# Patient Record
Sex: Male | Born: 1990 | Race: Black or African American | Hispanic: No | Marital: Single | Smoking: Current every day smoker
Health system: Southern US, Community
[De-identification: ages and names within clinical notes are randomized; demographics above are authoritative.]

## PROBLEM LIST (undated history)

## (undated) DIAGNOSIS — N200 Calculus of kidney: Secondary | ICD-10-CM

---
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
## 2013-10-01 ENCOUNTER — Emergency Department (HOSPITAL_COMMUNITY): Payer: Self-pay

## 2013-10-01 ENCOUNTER — Encounter (HOSPITAL_COMMUNITY): Payer: Self-pay | Admitting: Emergency Medicine

## 2013-10-01 ENCOUNTER — Emergency Department (HOSPITAL_COMMUNITY)
Admission: EM | Admit: 2013-10-01 | Discharge: 2013-10-01 | Disposition: A | Discharge status: Home / Self Care | Payer: Self-pay | Attending: Emergency Medicine | Admitting: Emergency Medicine

## 2013-10-01 DIAGNOSIS — N200 Calculus of kidney: Secondary | ICD-10-CM | POA: Insufficient documentation

## 2013-10-01 DIAGNOSIS — Z7901 Long term (current) use of anticoagulants: Secondary | ICD-10-CM | POA: Insufficient documentation

## 2013-10-01 LAB — URINALYSIS, ROUTINE W REFLEX MICROSCOPIC
Bilirubin Urine: NEGATIVE
Glucose, UA: NEGATIVE mg/dL
Ketones, ur: NEGATIVE mg/dL
Nitrite: NEGATIVE
Protein, ur: NEGATIVE mg/dL
Urobilinogen, UA: 1 mg/dL (ref 0.0–1.0)

## 2013-10-01 LAB — CBC WITH DIFFERENTIAL/PLATELET
Basophils Absolute: 0 10*3/uL (ref 0.0–0.1)
Basophils Relative: 1 % (ref 0–1)
Eosinophils Absolute: 0.1 10*3/uL (ref 0.0–0.7)
Eosinophils Relative: 1 % (ref 0–5)
MCH: 30.1 pg (ref 26.0–34.0)
MCHC: 35.6 g/dL (ref 30.0–36.0)
MCV: 84.6 fL (ref 78.0–100.0)
Monocytes Relative: 4 % (ref 3–12)
Platelets: 269 10*3/uL (ref 150–400)
RDW: 12.6 % (ref 11.5–15.5)
WBC: 5.3 10*3/uL (ref 4.0–10.5)

## 2013-10-01 LAB — COMPREHENSIVE METABOLIC PANEL
ALT: 20 U/L (ref 0–53)
AST: 20 U/L (ref 0–37)
Albumin: 4.1 g/dL (ref 3.5–5.2)
Alkaline Phosphatase: 61 U/L (ref 39–117)
Calcium: 9.2 mg/dL (ref 8.4–10.5)
Chloride: 102 mEq/L (ref 96–112)
GFR calc Af Amer: >90 mL/min (ref >90)
Glucose, Bld: 78 mg/dL (ref 70–99)
Sodium: 137 mEq/L (ref 135–145)
Total Bilirubin: 0.4 mg/dL (ref 0.3–1.2)
Total Protein: 7 g/dL (ref 6.0–8.3)

## 2013-10-01 LAB — URINE MICROSCOPIC-ADD ON

## 2013-10-01 MED ORDER — ONDANSETRON HCL 4 MG PO TABS: 4.0000 mg | ORAL_TABLET | Freq: Four times a day (QID) | ORAL | Status: DC

## 2013-10-01 MED ORDER — HYDROCODONE-ACETAMINOPHEN 5-325 MG PO TABS
1.0000 | ORAL_TABLET | ORAL | Status: DC | PRN
Start: 2013-10-01 — End: 2019-06-15

## 2013-10-01 MED ORDER — SODIUM CHLORIDE 0.9 % IV BOLUS (SEPSIS)
1000.0000 mL | Freq: Once | INTRAVENOUS | Status: AC
  Administered 2013-10-01: 1000 mL via INTRAVENOUS

## 2013-10-01 MED ORDER — TAMSULOSIN HCL 0.4 MG PO CAPS: 0.4000 mg | ORAL_CAPSULE | Freq: Every day | ORAL | Status: DC

## 2013-10-01 MED ORDER — IBUPROFEN 800 MG PO TABS: 800.0000 mg | ORAL_TABLET | Freq: Three times a day (TID) | ORAL | Status: DC

## 2013-10-01 MED ORDER — ONDANSETRON HCL 4 MG/2ML IJ SOLN
4.0000 mg | Freq: Once | INTRAMUSCULAR | Status: AC
  Administered 2013-10-01: 4 mg via INTRAVENOUS
  Filled 2013-10-01: qty 2

## 2013-10-01 MED ORDER — KETOROLAC TROMETHAMINE 30 MG/ML IJ SOLN
30.0000 mg | Freq: Once | INTRAMUSCULAR | Status: AC
  Administered 2013-10-01: 30 mg via INTRAVENOUS
  Filled 2013-10-01: qty 1

## 2013-10-01 NOTE — ED Provider Notes (Signed)
TIME SEEN: 11:18 AM  CHIEF COMPLAINT: Hematuria  HPI: Patient is a 22 y.o. male with no significant past medical history who presents the emergency department with hematuria and sharp left flank pain without radiation that started at 6 AM this morning. No aggravating or alleviating factors. He has had some mild pain with urination. Denies any trauma to the area. He is on anticoagulation. No penile discharge, testicular swelling or pain. No fevers, chills, nausea, vomiting or diarrhea. He states he last had sexual intercourse in June. No prior history of sexually transmitted diseases. No prior history of kidney stones.   ROS: See HPI Constitutional: no fever  Eyes: no drainage  ENT: no runny nose   Cardiovascular:  no chest pain  Resp: no SOB  GI: no vomiting GU:  dysuria Integumentary: no rash  Allergy: no hives  Musculoskeletal: no leg swelling  Neurological: no slurred speech ROS otherwise negative  PAST MEDICAL HISTORY/PAST SURGICAL HISTORY:  History reviewed. No pertinent past medical history.  MEDICATIONS:  Prior to Admission medications   Not on File    ALLERGIES:  No Known Allergies  SOCIAL HISTORY:  History  Substance Use Topics  . Smoking status: Never Smoker   . Smokeless tobacco: Not on file  . Alcohol Use: No    FAMILY HISTORY: History reviewed. No pertinent family history.  EXAM: BP 111/64  Pulse 68  Temp(Src) 98.1 F (36.7 C) (Oral)  Resp 20  Ht 5\' 10"  (1.778 m)  Wt 140 lb (63.504 kg)  BMI 20.09 kg/m2  SpO2 100% CONSTITUTIONAL: Alert and oriented and responds appropriately to questions. Well-appearing; well-nourished HEAD: Normocephalic EYES: Conjunctivae clear, PERRL ENT: normal nose; no rhinorrhea; moist mucous membranes; pharynx without lesions noted NECK: Supple, no meningismus, no LAD  CARD: RRR; S1 and S2 appreciated; no murmurs, no clicks, no rubs, no gallops RESP: Normal chest excursion without splinting or tachypnea; breath sounds clear  and equal bilaterally; no wheezes, no rhonchi, no rales,  ABD/GI: Normal bowel sounds; non-distended; soft, non-tender, no rebound, no guarding GU:  No external genital lesions, no testicular pain or swelling, no scrotal masses, 2+ femoral pulses bilaterally, blood at the urethral meatus without penile discharge BACK:  The back appears normal and is non-tender to palpation, there is no CVA tenderness EXT: Normal ROM in all joints; non-tender to palpation; no edema; normal capillary refill; no cyanosis    SKIN: Normal color for age and race; warm NEURO: Moves all extremities equally PSYCH: The patient's mood and manner are appropriate. Grooming and personal hygiene are appropriate.  MEDICAL DECISION MAKING: Patient here with hematuria, dysuria and left-sided flank pain that started this morning. He is hemodynamically stable. Suspect UTI versus kidney stone. Will obtain labs, urine and CT abdomen and pelvis. We'll give Toradol, Zofran, IV fluids.  ED PROGRESS: Patient's labs are unremarkable. His CT scan shows a tiny nonobstructing stone within the kidney without signs of hydronephrosis. Given patient's scan confirms kidney stones, suspect his symptoms today are either due to very small kidney stone that we are not seen on CT scan or a kidney stone that has recently passed. Patient reports his pain is improved after Toradol. Awaiting urinalysis.   Urine shows large blood and trace leukocytes. No other sign of infection. Urine culture is pending. Suspect patient's symptoms are due to a recently passed kidney stone or a very small stone that we are not seen on CT scan. We'll discharge home with pain medication, antibiotics, Flomax and urology followup. Given return precautions.  Patient verbalized understanding and is comfortable with plan.  Layla Maw Cecille Mcclusky, DO 10/01/13 1426

## 2013-10-01 NOTE — ED Notes (Signed)
Pt reports sudden onset of urinating blood at 0600 this am associated with Left flank pain, lower abd pain, and nausea. Pt denies any hx of kidney stone

## 2013-10-03 LAB — URINE CULTURE: Culture: NO GROWTH

## 2017-03-11 ENCOUNTER — Encounter (HOSPITAL_COMMUNITY): Payer: Self-pay | Admitting: Emergency Medicine

## 2017-03-11 ENCOUNTER — Emergency Department (HOSPITAL_COMMUNITY): Payer: Self-pay

## 2017-03-11 ENCOUNTER — Emergency Department (HOSPITAL_COMMUNITY)
Admission: EM | Admit: 2017-03-11 | Discharge: 2017-03-11 | Disposition: A | Discharge status: Home / Self Care | Payer: Self-pay | Attending: Emergency Medicine | Admitting: Emergency Medicine

## 2017-03-11 DIAGNOSIS — R1011 Right upper quadrant pain: Secondary | ICD-10-CM | POA: Insufficient documentation

## 2017-03-11 DIAGNOSIS — R1010 Upper abdominal pain, unspecified: Secondary | ICD-10-CM

## 2017-03-11 DIAGNOSIS — F17200 Nicotine dependence, unspecified, uncomplicated: Secondary | ICD-10-CM | POA: Insufficient documentation

## 2017-03-11 DIAGNOSIS — R10811 Right upper quadrant abdominal tenderness: Secondary | ICD-10-CM

## 2017-03-11 HISTORY — DX: Calculus of kidney: N20.0

## 2017-03-11 LAB — CBC
HCT: 42.9 % (ref 39.0–52.0)
Hemoglobin: 14.9 g/dL (ref 13.0–17.0)
MCH: 30.2 pg (ref 26.0–34.0)
MCHC: 34.7 g/dL (ref 30.0–36.0)
MCV: 86.8 fL (ref 78.0–100.0)
Platelets: 275 10*3/uL (ref 150–400)
RBC: 4.94 MIL/uL (ref 4.22–5.81)
RDW: 13 % (ref 11.5–15.5)
WBC: 5.9 10*3/uL (ref 4.0–10.5)

## 2017-03-11 LAB — URINALYSIS, ROUTINE W REFLEX MICROSCOPIC
Bacteria, UA: NONE SEEN
Bilirubin Urine: NEGATIVE
Glucose, UA: NEGATIVE mg/dL
Hgb urine dipstick: NEGATIVE
Ketones, ur: NEGATIVE mg/dL
Nitrite: NEGATIVE
Protein, ur: NEGATIVE mg/dL
Specific Gravity, Urine: 1.024 (ref 1.005–1.030)
pH: 7 (ref 5.0–8.0)

## 2017-03-11 LAB — COMPREHENSIVE METABOLIC PANEL
ALT: 18 U/L (ref 17–63)
AST: 24 U/L (ref 15–41)
Albumin: 4.3 g/dL (ref 3.5–5.0)
Alkaline Phosphatase: 54 U/L (ref 38–126)
Anion gap: 8 (ref 5–15)
BUN: 9 mg/dL (ref 6–20)
CO2: 24 mmol/L (ref 22–32)
Calcium: 9.3 mg/dL (ref 8.9–10.3)
Chloride: 107 mmol/L (ref 101–111)
Creatinine, Ser: 0.87 mg/dL (ref 0.61–1.24)
GFR calc Af Amer: >60 mL/min (ref >60)
GFR calc non Af Amer: >60 mL/min (ref >60)
Glucose, Bld: 74 mg/dL (ref 65–99)
Potassium: 3.6 mmol/L (ref 3.5–5.1)
Sodium: 139 mmol/L (ref 135–145)
Total Bilirubin: 0.8 mg/dL (ref 0.3–1.2)
Total Protein: 7.1 g/dL (ref 6.5–8.1)

## 2017-03-11 LAB — POC OCCULT BLOOD, ED: FECAL OCCULT BLD: NEGATIVE

## 2017-03-11 LAB — LIPASE, BLOOD: Lipase: 21 U/L (ref 11–51)

## 2017-03-11 MED ORDER — GI COCKTAIL ~~LOC~~
30.0000 mL | Freq: Once | ORAL | Status: DC
  Filled 2017-03-11: qty 30

## 2017-03-11 MED ORDER — OMEPRAZOLE 20 MG PO CPDR: 20.0000 mg | DELAYED_RELEASE_CAPSULE | Freq: Every day | ORAL | 0 refills | Status: DC

## 2017-03-11 NOTE — ED Notes (Signed)
Pty states he understands instructions. Home stable with steady gait.

## 2017-03-11 NOTE — ED Provider Notes (Signed)
[facility]-EMERGENCY DEPT Provider Note   CSN: [id number] Arrival date & time: 03/11/17  1147   By signing my name below, I, Julian Burton, attest that this documentation has been prepared under the direction and in the presence of Julian Russo, PA-C. Electronically Signed: Freida Busmaniana Burton, Scribe. 03/11/2017. 5:11 PM.  History   Chief Complaint Chief Complaint  Patient presents with  . Blood In Stools  . Abdominal Pain    The history is provided by the patient.  No language interpreter was used.    HPI Comments:  Julian Burton is a 26 y.o. male who presents to the Emergency Department complaining of intermittent cramping, periumbilical abdominal pain x 3 weeks. He rates his pain a 9.5/10. He states he occasionally has pain after eating. He reports associated episodes of bright red blood with BM seen in toilet bowl. He states the BM have been otherwise normal. Pt denies h/o similar pain. No h/o appendectomy or cholethiasis. No alleviating factors noted.  He denies fever, chills, dysuria, hematuria, testicular pain, nausea and vomiting.  Pt states he has "2 [place] drinks about every other day". Pt smokes ~ 10 cigarettes a day  Past Medical History:  Diagnosis Date  . Kidney stones     There are no active problems to display for this patient.   History reviewed. No pertinent surgical history.   Home Medications    Prior to Admission medications   Medication Sig Start Date End Date Taking? Authorizing Provider  HYDROcodone-acetaminophen (NORCO/VICODIN) 5-325 MG per tablet Take 1 tablet by mouth every 4 (four) hours as needed. 10/01/13   Ward, Layla MawKristen N, DO  ibuprofen (ADVIL,MOTRIN) 800 MG tablet Take 1 tablet (800 mg total) by mouth 3 (three) times daily. 10/01/13   Ward, Layla MawKristen N, DO  omeprazole (PRILOSEC) 20 MG capsule Take 1 capsule (20 mg total) by mouth daily. 03/11/17   Burton, Julian N, PA-C  ondansetron (ZOFRAN) 4 MG tablet Take 1 tablet (4 mg total) by mouth every 6 (six)  hours. 10/01/13   Ward, Layla MawKristen N, DO  tamsulosin (FLOMAX) 0.4 MG CAPS capsule Take 1 capsule (0.4 mg total) by mouth daily. 10/01/13   Ward, Layla MawKristen N, DO    Family History No family history on file.  Social History Social History  Substance Use Topics  . Smoking status: Current Every Day Smoker    Packs/day: 0.50  . Smokeless tobacco: Never Used  . Alcohol use 1.2 oz/week    2 Shots of liquor per week     Comment: 2 shots every other day     Allergies   Patient has no known allergies.   Review of Systems Review of Systems  Constitutional: Negative for chills and fever.  Gastrointestinal: Positive for abdominal pain and blood in stool. Negative for nausea and vomiting.  Genitourinary: Negative for dysuria, hematuria and testicular pain.  All other systems reviewed and are negative.   Physical Exam Updated Vital Signs BP 113/78 (BP Location: Right Arm)   Pulse 72   Temp 98.2 F (36.8 C)   Resp 18   Ht 5\' 10"  (1.778 m)   Wt 62.1 kg   SpO2 100%   BMI 19.66 kg/m   Physical Exam  Constitutional: He appears well-developed and well-nourished. No distress.  HENT:  Head: Normocephalic and atraumatic.  Eyes: Conjunctivae are normal.  Neck: Normal range of motion.  Cardiovascular: Normal rate, regular rhythm, normal heart sounds and intact distal pulses.  Exam reveals no friction rub.   No  murmur heard. Pulmonary/Chest: Effort normal and breath sounds normal. No respiratory distress. He has no wheezes. He has no rales.  Abdominal: Soft. Bowel sounds are normal. He exhibits no distension. There is tenderness in the right upper quadrant, epigastric area and periumbilical area. There is guarding. There is no rebound.  Genitourinary: Rectum normal and prostate normal. Rectal exam shows guaiac negative stool.  Genitourinary Comments:  Chaperone (scribe) was present for exam which was performed with no discomfort or complications.   Psychiatric: He has a normal mood and  affect. His behavior is normal.  Nursing note and vitals reviewed.    ED Treatments / Results  DIAGNOSTIC STUDIES:  Oxygen Saturation is 99% on RA, normal by my interpretation.    COORDINATION OF CARE:  1:13 PM Discussed treatment plan with pt at bedside and pt agreed to plan.  Labs (all labs ordered are listed, but only abnormal results are displayed) Labs Reviewed  URINALYSIS, ROUTINE W REFLEX MICROSCOPIC - Abnormal; Notable for the following:       Result Value   Leukocytes, UA TRACE (*)    Squamous Epithelial / LPF 0-5 (*)    All other components within normal limits  LIPASE, BLOOD  COMPREHENSIVE METABOLIC PANEL  CBC  POC OCCULT BLOOD, ED    EKG  EKG Interpretation None       Radiology US Abdomen Limited Ruq  Result Date: 03/11/2017 CLINICAL DATA:  Right upper quadrant pain. EXAM: US ABDOMEN LIMITED - RIGHT UPPER QUADRANT COMPARISON:  CT of the abdomen without contrast on 10/01/2013 FINDINGS: Gallbladder: No gallstones or wall thickening visualized. No sonographic Murphy sign noted by sonographer. Common bile duct: Diameter: 2 mm. Liver: Rounded echogenic area measuring approximately 0.8 cm is seen at the interface between the liver and the upper right kidney. This could be a small hemangioma of the liver or potentially a small angiomyolipoma of the kidney. The rest of the liver is normal. IMPRESSION: Unremarkable right upper quadrant ultrasound. Small echogenic lesion measuring 0.8 cm at the interface between the liver and upper right kidney may represent subcapsular hepatic hemangioma versus angiomyolipoma of the kidney. Electronically Signed   By: Irish Lack M.D.   On: 03/11/2017 15:07    Procedures Procedures (including critical care time)  Medications Ordered in ED Medications  gi cocktail (Maalox,Lidocaine,Donnatal) (30 mLs Oral Refused 03/11/17 1617)     Initial Impression / Assessment and Plan / ED Course  I have reviewed the triage vital signs and the  nursing notes.  Pertinent labs & imaging results that were available during my care of the patient were reviewed by me and considered in my medical decision making (see chart for details).    Patient with umbilical, epigastric, right upper quadrant abdominal pain. Labs reassuring and within normal limits. Right upper quadrant ultrasound without acute pathology. Patient is nontoxic, nonseptic appearing, in no apparent distress. VSS. Patient does not meet the SIRS or Sepsis criteria.  No indication of appendicitis, bowel obstruction, bowel perforation, cholecystitis, diverticulitis. Pt declined GI cocktail, requesting to leave. Discussed U/S findings and encouraged PCP follow up. Also encouraged pt to decrease his alcohol intake. Patient discharged home with symptomatic treatment and given strict return precautions.  Pt safe for discharge.  Patient discussed with Dr. Juleen China.  Discussed results, findings, treatment and follow up. Patient advised of return precautions. Patient verbalized understanding and agreed with plan.  Final Clinical Impressions(s) / ED Diagnoses   Final diagnoses:  RUQ abdominal tenderness  Pain of upper abdomen  New Prescriptions Discharge Medication List as of 03/11/2017  4:12 PM    START taking these medications   Details  omeprazole (PRILOSEC) 20 MG capsule Take 1 capsule (20 mg total) by mouth daily., Starting Tue 03/11/2017, Print       I personally performed the services described in this documentation, which was scribed in my presence. The recorded information has been reviewed and is accurate.   Burton, [place] N, PA-C 03/11/17 1713  Raeford Razor, MD 03/11/17 2110

## 2017-03-11 NOTE — Discharge Instructions (Signed)
Please Read instructions below. Decrease your alcohol consumption. Take 1 omeprazole (prilosec) per day as needed for stomach upset. Schedule an appointment with primary care to establish care and follow-up on your abdominal pain. Return to the ER for fever, if you stop having bowel movements, new or worsening symptoms.

## 2017-03-11 NOTE — ED Notes (Signed)
Patient called 4x, no response.

## 2017-03-11 NOTE — ED Triage Notes (Signed)
Pt has abd pain for 3 weeks- no change today-- has not tried any OTC meds. States coworkers told him that he may have H Pylori-- also c/o bright red blood in stools occasionally

## 2017-03-19 ENCOUNTER — Emergency Department (HOSPITAL_COMMUNITY)
Admission: EM | Admit: 2017-03-19 | Discharge: 2017-03-19 | Discharge status: Against Medical Advice | Payer: Self-pay | Attending: Dermatology | Admitting: Dermatology

## 2017-03-19 ENCOUNTER — Encounter (HOSPITAL_COMMUNITY): Payer: Self-pay | Admitting: *Deleted

## 2017-03-19 DIAGNOSIS — Z5321 Procedure and treatment not carried out due to patient leaving prior to being seen by health care provider: Secondary | ICD-10-CM | POA: Insufficient documentation

## 2017-03-19 DIAGNOSIS — R109 Unspecified abdominal pain: Secondary | ICD-10-CM | POA: Insufficient documentation

## 2017-03-19 DIAGNOSIS — F17200 Nicotine dependence, unspecified, uncomplicated: Secondary | ICD-10-CM | POA: Insufficient documentation

## 2017-03-19 NOTE — ED Triage Notes (Addendum)
Pt reports ongoing left side abd pain. Was seen here on 5/8 for same and states that "he just needs a checkup." denies any pain at this time. Denies n/v/d. Pt reports that he only wants an US done again to check his liver? Does not want elaborate testing done at triage.

## 2017-03-19 NOTE — ED Notes (Signed)
Pt called for vital signs x 3

## 2017-03-20 ENCOUNTER — Encounter (HOSPITAL_COMMUNITY): Payer: Self-pay | Admitting: Emergency Medicine

## 2017-03-20 ENCOUNTER — Emergency Department (HOSPITAL_COMMUNITY): Payer: Self-pay

## 2017-03-20 ENCOUNTER — Emergency Department (HOSPITAL_COMMUNITY)
Admission: EM | Admit: 2017-03-20 | Discharge: 2017-03-20 | Disposition: A | Discharge status: Home / Self Care | Payer: Self-pay | Attending: Emergency Medicine | Admitting: Emergency Medicine

## 2017-03-20 DIAGNOSIS — F17200 Nicotine dependence, unspecified, uncomplicated: Secondary | ICD-10-CM | POA: Insufficient documentation

## 2017-03-20 DIAGNOSIS — Z79899 Other long term (current) drug therapy: Secondary | ICD-10-CM | POA: Insufficient documentation

## 2017-03-20 DIAGNOSIS — R109 Unspecified abdominal pain: Secondary | ICD-10-CM | POA: Insufficient documentation

## 2017-03-20 LAB — COMPREHENSIVE METABOLIC PANEL
ALK PHOS: 52 U/L (ref 38–126)
ALT: 17 U/L (ref 17–63)
AST: 22 U/L (ref 15–41)
Albumin: 3.8 g/dL (ref 3.5–5.0)
Anion gap: 4 — ABNORMAL LOW (ref 5–15)
BUN: <5 mg/dL — ABNORMAL LOW (ref 6–20)
CALCIUM: 9 mg/dL (ref 8.9–10.3)
CO2: 30 mmol/L (ref 22–32)
CREATININE: 0.98 mg/dL (ref 0.61–1.24)
Chloride: 106 mmol/L (ref 101–111)
GFR calc Af Amer: >60 mL/min (ref >60)
Glucose, Bld: 96 mg/dL (ref 65–99)
Potassium: 4 mmol/L (ref 3.5–5.1)
Sodium: 140 mmol/L (ref 135–145)
Total Bilirubin: 0.5 mg/dL (ref 0.3–1.2)
Total Protein: 6.1 g/dL — ABNORMAL LOW (ref 6.5–8.1)

## 2017-03-20 LAB — RAPID URINE DRUG SCREEN, HOSP PERFORMED
Amphetamines: NOT DETECTED
BARBITURATES: NOT DETECTED
Benzodiazepines: NOT DETECTED
Cocaine: NOT DETECTED
Opiates: NOT DETECTED
TETRAHYDROCANNABINOL: NOT DETECTED

## 2017-03-20 LAB — URINALYSIS, ROUTINE W REFLEX MICROSCOPIC
BILIRUBIN URINE: NEGATIVE
GLUCOSE, UA: NEGATIVE mg/dL
HGB URINE DIPSTICK: NEGATIVE
KETONES UR: NEGATIVE mg/dL
Nitrite: NEGATIVE
Protein, ur: NEGATIVE mg/dL
Specific Gravity, Urine: 1.018 (ref 1.005–1.030)
pH: 6 (ref 5.0–8.0)

## 2017-03-20 LAB — CBC
HEMATOCRIT: 42.4 % (ref 39.0–52.0)
Hemoglobin: 14.7 g/dL (ref 13.0–17.0)
MCH: 30.1 pg (ref 26.0–34.0)
MCHC: 34.7 g/dL (ref 30.0–36.0)
MCV: 86.9 fL (ref 78.0–100.0)
Platelets: 261 10*3/uL (ref 150–400)
RBC: 4.88 MIL/uL (ref 4.22–5.81)
RDW: 13.5 % (ref 11.5–15.5)
WBC: 5.4 10*3/uL (ref 4.0–10.5)

## 2017-03-20 LAB — LIPASE, BLOOD: LIPASE: 23 U/L (ref 11–51)

## 2017-03-20 MED ORDER — IOPAMIDOL (ISOVUE-300) INJECTION 61%
INTRAVENOUS | Status: AC
  Administered 2017-03-20: 100 mL via INTRAVENOUS
  Filled 2017-03-20: qty 100

## 2017-03-20 NOTE — ED Notes (Addendum)
Pt left ED to go smoke

## 2017-03-20 NOTE — ED Triage Notes (Signed)
Pt sts left sided abd pain yesterday; pt denies pain today but sts chronic in nature

## 2017-03-20 NOTE — ED Provider Notes (Signed)
[facility]-EMER[facility]Y DEPT Provider Note   CSN: [id number] Arrival date & time: 03/20/17  1003     History   Chief Complaint Chief Complaint  Patient presents with  . Abdominal Pain    HPI Julian Burton is a 26 y.o. male.  26yo M who p/w left side pain. The patient reports 1 month h/o intermittent L side pain worse with movement and better at rest. It is not associated with eating. No nausea, vomiting, diarrhea; blood in stool initially but not now. No urinary sx or fevers. He has tried ibuprofen with no relief. He was evaluated here earlier this month for the same symptoms with reassuring workup. He presents because of the persistence of his intermittent pain. No recent change in physical activity.   The history is provided by the patient.  Abdominal Pain      Past Medical History:  Diagnosis Date  . Kidney stones     There are no active problems to display for this patient.   History reviewed. No pertinent surgical history.     Home Medications    Prior to Admission medications   Medication Sig Start Date End Date Taking? Authorizing Provider  HYDROcodone-acetaminophen (NORCO/VICODIN) 5-325 MG per tablet Take 1 tablet by mouth every 4 (four) hours as needed. 10/01/13   Ward, Layla Maw, DO  ibuprofen (ADVIL,MOTRIN) 800 MG tablet Take 1 tablet (800 mg total) by mouth 3 (three) times daily. 10/01/13   Ward, Layla Maw, DO  omeprazole (PRILOSEC) 20 MG capsule Take 1 capsule (20 mg total) by mouth daily. 03/11/17   Russo, [place] N, PA-C  ondansetron (ZOFRAN) 4 MG tablet Take 1 tablet (4 mg total) by mouth every 6 (six) hours. 10/01/13   Ward, Layla Maw, DO  tamsulosin (FLOMAX) 0.4 MG CAPS capsule Take 1 capsule (0.4 mg total) by mouth daily. 10/01/13   Ward, Layla Maw, DO    Family History History reviewed. No pertinent family history.  Social History Social History  Substance Use Topics  . Smoking status: Current Every Day Smoker    Packs/day: 0.50  . Smokeless  tobacco: Never Used  . Alcohol use 1.2 oz/week    2 Shots of liquor per week     Comment: 2 shots every other day     Allergies   Patient has no known allergies.   Review of Systems Review of Systems  Gastrointestinal: Positive for abdominal pain.     Physical Exam Updated Vital Signs BP 123/87   Pulse 62   Temp 98.6 F (37 C) (Oral)   Resp 16   SpO2 100%   Physical Exam  Constitutional: He is oriented to person, place, and time. He appears well-developed and well-nourished. No distress.  HENT:  Head: Normocephalic and atraumatic.  Moist mucous membranes  Eyes: Conjunctivae are normal. Pupils are equal, round, and reactive to light.  Neck: Neck supple.  Cardiovascular: Normal rate, regular rhythm and normal heart sounds.   No murmur heard. Pulmonary/Chest: Effort normal and breath sounds normal.  Abdominal: Soft. Bowel sounds are normal. He exhibits no distension and no mass. There is tenderness in the left upper quadrant. There is no rebound and no guarding.  Musculoskeletal: He exhibits no edema.  Neurological: He is alert and oriented to person, place, and time.  Fluent speech  Skin: Skin is warm and dry.  Psychiatric: He has a normal mood and affect. Judgment normal.  Nursing note and vitals reviewed.    ED Treatments / Results  Labs (all  labs ordered are listed, but only abnormal results are displayed) Labs Reviewed  COMPREHENSIVE METABOLIC PANEL - Abnormal; Notable for the following:       Result Value   BUN <5 (*)    Total Protein 6.1 (*)    Anion gap 4 (*)    All other components within normal limits  URINALYSIS, ROUTINE W REFLEX MICROSCOPIC - Abnormal; Notable for the following:    Leukocytes, UA TRACE (*)    Bacteria, UA RARE (*)    Squamous Epithelial / LPF 0-5 (*)    All other components within normal limits  LIPASE, BLOOD  CBC  RAPID URINE DRUG SCREEN, HOSP PERFORMED    EKG  EKG Interpretation None       Radiology Ct Abdomen  Pelvis W Contrast  Result Date: 03/20/2017 CLINICAL DATA:  Left upper quadrant persistent abdominal pain EXAM: CT ABDOMEN AND PELVIS WITH CONTRAST TECHNIQUE: Multidetector CT imaging of the abdomen and pelvis was performed using the standard protocol following bolus administration of intravenous contrast. CONTRAST:  100mL ISOVUE-300 IOPAMIDOL (ISOVUE-300) INJECTION 61% COMPARISON:  03/11/2017 abdominal ultrasound, 10/01/2013 CT FINDINGS: Lower chest: No acute abnormality. Hepatobiliary: No focal liver abnormality is seen. No gallstones, gallbladder wall thickening, or biliary dilatation. Pancreas: Unremarkable. No pancreatic ductal dilatation or surrounding inflammatory changes. Spleen: Normal in size without focal abnormality. Adrenals/Urinary Tract: Adrenal glands are unremarkable. Kidneys are normal, without renal calculi, focal lesion, or hydronephrosis. Bladder is unremarkable. Stomach/Bowel: Stomach is within normal limits. Appendix appears normal. No evidence of bowel wall thickening, distention, or inflammatory changes. Vascular/Lymphatic: No significant vascular findings are present. No enlarged abdominal or pelvic lymph nodes. Reproductive: Prostate is unremarkable. Other: No abdominal wall hernia or abnormality. No abdominopelvic ascites. Musculoskeletal: No acute or significant osseous findings. IMPRESSION: No acute intra- abdominal or pelvic finding by CT. Electronically Signed   By: Judie PetitM.  Shick M.D.   On: 03/20/2017 14:31    Procedures Procedures (including critical care time)  Medications Ordered in ED Medications  iopamidol (ISOVUE-300) 61 % injection (100 mLs Intravenous Contrast Given 03/20/17 1408)     Initial Impression / Assessment and Plan / ED Course  I have reviewed the triage vital signs and the nursing notes.  Pertinent labs & imaging results that were available during my care of the patient were reviewed by me and considered in my medical decision making (see chart for  details).    Pt with 1 month Intermittent left-sided abdominal pain. Distant history of kidney stones but states that this does not feel the same. He was well-appearing on exam with normal vital signs. He had focal left upper quadrant tenderness without splenomegaly or other areas of pain. Lab work overall reassuring with normal LFTs and lipase, UA negative for hematuria or infection. Because of the persistence of his pain, I discussed risks and benefits of CT scanning although I explained that it was unlikely to be abnormal given the location of his pain. The patient voiced understanding but wanted to proceed with imaging. Obtained CT which was negative. Because his symptoms predominantly worsen with movement, I suspect possible musculoskeletal etiology. I discussed supportive measures and reviewed return precautions. Patient voiced understanding and was discharged in satisfactory condition.  Final Clinical Impressions(s) / ED Diagnoses   Final diagnoses:  Left sided abdominal pain    New Prescriptions New Prescriptions   No medications on file     Little, Ambrose Finlandachel Morgan, MD 03/20/17 1458

## 2017-09-04 ENCOUNTER — Emergency Department (HOSPITAL_COMMUNITY)
Admission: EM | Admit: 2017-09-04 | Discharge: 2017-09-04 | Disposition: A | Discharge status: Home / Self Care | Payer: Self-pay | Attending: Emergency Medicine | Admitting: Emergency Medicine

## 2017-09-04 ENCOUNTER — Encounter (HOSPITAL_COMMUNITY): Payer: Self-pay | Admitting: Obstetrics and Gynecology

## 2017-09-04 DIAGNOSIS — F17210 Nicotine dependence, cigarettes, uncomplicated: Secondary | ICD-10-CM | POA: Insufficient documentation

## 2017-09-04 DIAGNOSIS — R369 Urethral discharge, unspecified: Secondary | ICD-10-CM

## 2017-09-04 DIAGNOSIS — N489 Disorder of penis, unspecified: Secondary | ICD-10-CM | POA: Insufficient documentation

## 2017-09-04 DIAGNOSIS — Z202 Contact with and (suspected) exposure to infections with a predominantly sexual mode of transmission: Secondary | ICD-10-CM | POA: Insufficient documentation

## 2017-09-04 DIAGNOSIS — R300 Dysuria: Secondary | ICD-10-CM | POA: Insufficient documentation

## 2017-09-04 DIAGNOSIS — A64 Unspecified sexually transmitted disease: Secondary | ICD-10-CM

## 2017-09-04 LAB — URINALYSIS, ROUTINE W REFLEX MICROSCOPIC
BACTERIA UA: NONE SEEN
Bilirubin Urine: NEGATIVE
GLUCOSE, UA: NEGATIVE mg/dL
HGB URINE DIPSTICK: NEGATIVE
KETONES UR: NEGATIVE mg/dL
NITRITE: NEGATIVE
PROTEIN: NEGATIVE mg/dL
Specific Gravity, Urine: 1.018 (ref 1.005–1.030)
pH: 6 (ref 5.0–8.0)

## 2017-09-04 MED ORDER — AZITHROMYCIN 250 MG PO TABS
1000.0000 mg | ORAL_TABLET | Freq: Once | ORAL | Status: AC
Start: 2017-09-04 — End: 2017-09-04
  Administered 2017-09-04: 1000 mg via ORAL
  Filled 2017-09-04: qty 4

## 2017-09-04 MED ORDER — CEFTRIAXONE SODIUM 250 MG IJ SOLR
250.0000 mg | Freq: Once | INTRAMUSCULAR | Status: AC
  Administered 2017-09-04: 250 mg via INTRAMUSCULAR
  Filled 2017-09-04: qty 250

## 2017-09-04 MED ORDER — STERILE WATER FOR INJECTION IJ SOLN
INTRAMUSCULAR | Status: AC
  Administered 2017-09-04: 10 mL
  Filled 2017-09-04: qty 10

## 2017-09-04 NOTE — ED Provider Notes (Signed)
[facility]   CSN: [id number] Arrival date & time: 09/04/17  1820     History   Chief Complaint Chief Complaint  Patient presents with  . Penile Discharge    HPI Julian Burton is a 26 y.o. male who presents with 2 days of dysuria and penile discharge. Patient states that initially, symptoms started with a burning sensation when he patient reports that he then noticed having some white discharge noted from his penis. Patient reports that he is currently sexually active with 2 partners. Patient reports that with one partner and they do use condoms. He reports that with the other partner who he most recently had sex with a few days ago, he did not use a condom. He contacted this partner and she denied having any symptoms or being positive for any STDs. Patient states that he has not been sexual active with the other partner for approximately 2 months. Patient denies any fevers, chills, abdominal pain, testicular pain or swelling, sores, ulcers, lesions.  The history is provided by the patient.    Past Medical History:  Diagnosis Date  . Kidney stones     There are no active problems to display for this patient.   No past surgical history on file.     Home Medications    Prior to Admission medications   Medication Sig Start Date End Date Taking? Authorizing Provider  HYDROcodone-acetaminophen (NORCO/VICODIN) 5-325 MG per tablet Take 1 tablet by mouth every 4 (four) hours as needed. 10/01/13   Ward, Layla Maw, DO  ibuprofen (ADVIL,MOTRIN) 800 MG tablet Take 1 tablet (800 mg total) by mouth 3 (three) times daily. 10/01/13   Ward, Layla Maw, DO  omeprazole (PRILOSEC) 20 MG capsule Take 1 capsule (20 mg total) by mouth daily. 03/11/17   Robinson, [place] N, PA-C  ondansetron (ZOFRAN) 4 MG tablet Take 1 tablet (4 mg total) by mouth every 6 (six) hours. 10/01/13   Ward, Layla Maw, DO  tamsulosin (FLOMAX) 0.4 MG CAPS capsule Take 1  capsule (0.4 mg total) by mouth daily. 10/01/13   Ward, Layla Maw, DO    Family History No family history on file.  Social History Social History  Substance Use Topics  . Smoking status: Current Every Day Smoker    Packs/day: 0.50    Types: Cigarettes  . Smokeless tobacco: Never Used  . Alcohol use 1.2 oz/week    2 Shots of liquor per week     Comment: 2 shots every other day     Allergies   Patient has no known allergies.   Review of Systems Review of Systems  Constitutional: Negative for fever.  Genitourinary: Positive for discharge and dysuria. Negative for hematuria, penile swelling, scrotal swelling and testicular pain.     Physical Exam Updated Vital Signs BP 119/72 (BP Location: Left Arm)   Pulse 85   Temp 98.8 F (37.1 C) (Oral)   Resp 16   Ht 5\' 11"  (1.803 m)   Wt 63.5 kg (140 lb)   SpO2 99%   BMI 19.53 kg/m   Physical Exam  Constitutional: He appears well-developed and well-nourished.  Sitting comfortably on examination table  HENT:  Head: Normocephalic and atraumatic.  Eyes: Conjunctivae and EOM are normal. Right eye exhibits no discharge. Left eye exhibits no discharge. No scleral icterus.  Pulmonary/Chest: Effort normal.  Abdominal: Hernia confirmed negative in the right inguinal area and confirmed negative in the left inguinal area.  Genitourinary: Testes normal.  Right testis shows no mass, no swelling and no tenderness. Left testis shows no mass, no swelling and no tenderness. Circumcised. Discharge found.  Genitourinary Comments: The exam was performed with a chaperone present. Normal male genitalia. No evidence of rashes, ulcers, lesions to the penis. Patient does have white discharge noted from the urethral meatus. No testicular pain or swelling.  Neurological: He is alert.  Skin: Skin is warm and dry.  Psychiatric: He has a normal mood and affect. His speech is normal and behavior is normal.  Nursing note and vitals reviewed.    ED  Treatments / Results  Labs (all labs ordered are listed, but only abnormal results are displayed) Labs Reviewed  URINALYSIS, ROUTINE W REFLEX MICROSCOPIC - Abnormal; Notable for the following:       Result Value   APPearance HAZY (*)    Leukocytes, UA LARGE (*)    Squamous Epithelial / LPF 0-5 (*)    All other components within normal limits  URINE CULTURE  HIV ANTIBODY (ROUTINE TESTING)  RPR  GC/CHLAMYDIA PROBE AMP ([place]) NOT AT [facility]    EKG  EKG Interpretation None       Radiology No results found.  Procedures Procedures (including critical care time)  Medications Ordered in ED Medications  cefTRIAXone (ROCEPHIN) injection 250 mg (250 mg Intramuscular Given 09/04/17 2023)  azithromycin (ZITHROMAX) tablet 1,000 mg (1,000 mg Oral Given 09/04/17 2023)  sterile water (preservative free) injection (10 mLs  Given 09/04/17 2023)     Initial Impression / Assessment and Plan / ED Course  I have reviewed the triage vital signs and the nursing notes.  Pertinent labs & imaging results that were available during my care of the patient were reviewed by me and considered in my medical decision making (see chart for details).     26 year old male who presents with 2 days of dysuria and penile discharge. Currently sexually active with 2 partners. Recently had unprotected sex with one partner who days ago. No other symptoms. No fevers, abdominal pain. Patient is afebrile, non-toxic appearing, sitting comfortably on examination table. Vital signs reviewed and stable. Concern for gonorrhea/chlamydia. History/physical examination concerning for prostatitis, testicular torsion. Plan to check GU exam, GC/Chlamydia. Patient also reports wanting to be tested for HIV and RPR.  GU exam as documented above. There is evidence of white penile discharge. No penile sores, lesions, rashes. No testicular abnormalities. Given concerns of penile discharge and recent objective sex, discussed with  patient regarding options for receiving treatment for gonorrhea and chlamydia in the emergency department. Patient does wish to receive treatment here in the emergency pertinent. No known drug allergies.  UA reviewed. There is large leukocytes and pyuria noted. This is likely secondary to STD infection. Will plan to send for urine culture. Discussed with patient. Instructed him that the HIV and RPR results will come back the next 2-3 days. Also instructed that the GC/chlamydia results will come back in the next few days but he has artery receive treatments that he does not need any further antibiotic. Instructed him to abstain from sexual intercourse for the next 7 days and to inform his partners have been tested and treated. Patient had ample opportunity for questions and discussion. All patient's questions were answered with full understanding. Strict return precautions discussed. Patient expresses understanding and agreement to plan.    Final Clinical Impressions(s) / ED Diagnoses   Final diagnoses:  Penile discharge  STD (male)    New Prescriptions Discharge Medication List as  of 09/04/2017  9:22 PM       Maxwell Caul, PA-C 09/04/17 2302    Linwood Dibbles, MD 09/04/17 2350

## 2017-09-04 NOTE — ED Triage Notes (Signed)
Per Pt: Pt reports having unprotected sex. Pt reports a "leakage" from his penis that is like a thick white/green. Pt reports that it hurts to pee.  Pt reports that his partner is not currently having any symptoms.  Pt reports that his penis is sore to the touch.

## 2017-09-04 NOTE — Discharge Instructions (Signed)
Do not engage in any sexual intercourse for the next 7 days.  He needs to tell your partners about potentially having an STD and the need to be treated and tested. Denies sexual intercourse until they complete testing and treatment.  As we discussed, the results will take about 2-3 days to come back. If there is anything abnormal, you will be notified. You have already been treated for gonorrhea and chlamydia.   Follow-up with her primary care doctor in the next 2-4 days. If he do not have a primary care doctor, you can use the clinic listed in the paperwork.  Return the emergency Department for any worsening pain, persistent penile discharge, swelling or pain of the testicles, fever or any other worsening or concerning symptoms.

## 2017-09-05 LAB — HIV ANTIBODY (ROUTINE TESTING W REFLEX): HIV Screen 4th Generation wRfx: NONREACTIVE

## 2017-09-05 LAB — GC/CHLAMYDIA PROBE AMP (~~LOC~~) NOT AT ARMC
Chlamydia: NEGATIVE
Neisseria Gonorrhea: POSITIVE — AB

## 2017-09-05 LAB — RPR: RPR Ser Ql: NONREACTIVE

## 2017-09-07 LAB — URINE CULTURE: Culture: 20000 — AB

## 2018-02-13 ENCOUNTER — Emergency Department (HOSPITAL_COMMUNITY)
Admission: EM | Admit: 2018-02-13 | Discharge: 2018-02-13 | Disposition: A | Discharge status: Home / Self Care | Payer: Self-pay | Attending: Emergency Medicine | Admitting: Emergency Medicine

## 2018-02-13 ENCOUNTER — Encounter (HOSPITAL_COMMUNITY): Payer: Self-pay

## 2018-02-13 ENCOUNTER — Other Ambulatory Visit: Payer: Self-pay

## 2018-02-13 DIAGNOSIS — F17210 Nicotine dependence, cigarettes, uncomplicated: Secondary | ICD-10-CM | POA: Insufficient documentation

## 2018-02-13 DIAGNOSIS — Z202 Contact with and (suspected) exposure to infections with a predominantly sexual mode of transmission: Secondary | ICD-10-CM | POA: Insufficient documentation

## 2018-02-13 LAB — URINALYSIS, ROUTINE W REFLEX MICROSCOPIC
Bilirubin Urine: NEGATIVE
GLUCOSE, UA: NEGATIVE mg/dL
HGB URINE DIPSTICK: NEGATIVE
Ketones, ur: NEGATIVE mg/dL
LEUKOCYTES UA: NEGATIVE
Nitrite: NEGATIVE
PROTEIN: NEGATIVE mg/dL
SPECIFIC GRAVITY, URINE: 1.014 (ref 1.005–1.030)
pH: 7 (ref 5.0–8.0)

## 2018-02-13 MED ORDER — AZITHROMYCIN 250 MG PO TABS
1000.0000 mg | ORAL_TABLET | Freq: Once | ORAL | Status: AC
  Administered 2018-02-13: 1000 mg via ORAL
  Filled 2018-02-13: qty 4

## 2018-02-13 MED ORDER — CEFTRIAXONE SODIUM 250 MG IJ SOLR
250.0000 mg | Freq: Once | INTRAMUSCULAR | Status: AC
  Administered 2018-02-13: 250 mg via INTRAMUSCULAR
  Filled 2018-02-13: qty 250

## 2018-02-13 MED ORDER — METRONIDAZOLE 500 MG PO TABS: 500.0000 mg | ORAL_TABLET | Freq: Two times a day (BID) | ORAL | 0 refills | Status: AC

## 2018-02-13 MED ORDER — AZITHROMYCIN 1 G PO PACK: 1.0000 g | PACK | Freq: Once | ORAL | Status: DC

## 2018-02-13 NOTE — ED Triage Notes (Signed)
Pt endorses that his partner got diagnosed with trichamonas and he wanted to be tested. Endorses unprotected sex. Has no sx.

## 2018-02-13 NOTE — ED Provider Notes (Signed)
[facility] EMERGENCY DEPARTMENT Provider Note   CSN: [id number] Arrival date & time: 02/13/18  1717     History   Chief Complaint Chief Complaint  Patient presents with  . Exposure to STD    HPI Julian Burton is a 27 y.o. male.  HPI  Patient is a 27 year old male who is presenting the ED today to be evaluated for STDs.  States that his recent sexual partner was diagnosed with trichomonas and he had unprotected sex with them.  States he is in asymptomatic and has no lesions to the penis, no dysuria or penile discharge.  No redness or swelling to his genital area.  No pain to the scrotum or testes.  No fevers, abdominal pain, nausea, vomiting.   He is declining HIV and syphilis testing stating that he just had these test 2 months ago.  He is also declining gonorrhea and Chlamydia testing.  Past Medical History:  Diagnosis Date  . Kidney stones     There are no active problems to display for this patient.   History reviewed. No pertinent surgical history.      Home Medications    Prior to Admission medications   Medication Sig Start Date End Date Taking? Authorizing Provider  HYDROcodone-acetaminophen (NORCO/VICODIN) 5-325 MG per tablet Take 1 tablet by mouth every 4 (four) hours as needed. 10/01/13   Ward, Layla Maw, DO  ibuprofen (ADVIL,MOTRIN) 800 MG tablet Take 1 tablet (800 mg total) by mouth 3 (three) times daily. 10/01/13   Ward, Layla Maw, DO  metroNIDAZOLE (FLAGYL) 500 MG tablet Take 1 tablet (500 mg total) by mouth 2 (two) times daily for 7 days. 02/13/18 02/20/18  Locke Barrell S, PA-C  omeprazole (PRILOSEC) 20 MG capsule Take 1 capsule (20 mg total) by mouth daily. 03/11/17   Robinson, [place] N, PA-C  ondansetron (ZOFRAN) 4 MG tablet Take 1 tablet (4 mg total) by mouth every 6 (six) hours. 10/01/13   Ward, Layla Maw, DO  tamsulosin (FLOMAX) 0.4 MG CAPS capsule Take 1 capsule (0.4 mg total) by mouth daily. 10/01/13   Ward, Layla Maw, DO     Family History History reviewed. No pertinent family history.  Social History Social History   Tobacco Use  . Smoking status: Current Every Day Smoker    Packs/day: 0.50    Types: Cigarettes  . Smokeless tobacco: Never Used  Substance Use Topics  . Alcohol use: Yes    Alcohol/week: 1.2 oz    Types: 2 Shots of liquor per week    Comment: 2 shots every other day  . Drug use: No     Allergies   Patient has no known allergies.   Review of Systems Review of Systems  Constitutional: Negative for fever.  HENT: Negative for sinus pain.   Eyes: Negative for visual disturbance.  Respiratory: Negative for shortness of breath.   Cardiovascular: Negative for chest pain.  Gastrointestinal: Negative for abdominal pain, nausea and vomiting.  Genitourinary: Negative for discharge, dysuria, flank pain, frequency, hematuria, penile pain, penile swelling, scrotal swelling and testicular pain.  Musculoskeletal: Negative for back pain.  Skin: Negative for rash.  Neurological: Negative for headaches.     Physical Exam Updated Vital Signs BP 116/73 (BP Location: Right Arm)   Pulse 80   Temp 98 F (36.7 C) (Oral)   Resp 16   Ht 5\' 10"  (1.778 m)   Wt 68 kg (150 lb)   SpO2 100%   BMI 21.52 kg/m  Physical Exam  Constitutional: He appears well-developed and well-nourished. No distress.  HENT:  Head: Normocephalic and atraumatic.  Eyes: Conjunctivae are normal.  Neck: Neck supple.  Cardiovascular: Normal rate and regular rhythm.  No murmur heard. Pulmonary/Chest: Effort normal and breath sounds normal. No respiratory distress.  Abdominal: Soft. Bowel sounds are normal. There is no tenderness.  Genitourinary:  Genitourinary Comments: Chaperone present during GU exam.  Penis normal, no evidence of penile discharge or lesions noted.  Cremasteric reflex present bilaterally.  Musculoskeletal: He exhibits no edema.  Neurological: He is alert.  Skin: Skin is warm and dry.   Psychiatric: He has a normal mood and affect.  Nursing note and vitals reviewed.    ED Treatments / Results  Labs (all labs ordered are listed, but only abnormal results are displayed) Labs Reviewed  URINALYSIS, ROUTINE W REFLEX MICROSCOPIC    EKG None  Radiology No results found.  Procedures Procedures (including critical care time)  Medications Ordered in ED Medications  cefTRIAXone (ROCEPHIN) injection 250 mg (has no administration in time range)  azithromycin (ZITHROMAX) powder 1 g (has no administration in time range)     Initial Impression / Assessment and Plan / ED Course  I have reviewed the triage vital signs and the nursing notes.  Pertinent labs & imaging results that were available during my care of the patient were reviewed by me and considered in my medical decision making (see chart for details).      Final Clinical Impressions(s) / ED Diagnoses   Final diagnoses:  STD exposure   Patient is afebrile without abdominal tenderness, abdominal pain or painful bowel movements to indicate prostatitis.  No tenderness to palpation of the testes or epididymis to suggest orchitis or epididymitis.  Patient refused gonorrhea chlamydia, HIV, syphilis testing.  He was treated prophylactically for gonorrhea and chlamydia.  He was also given a prescription for Flagyl to treat trichomonas as he was recently exposed to someone with this.  Patient to be discharged with instructions to follow up with PCP. Discussed importance of using protection when sexually active.  Advised to return to the ED for any new or worsening symptoms.  All questions answered and patient understands the plan and reasons to return.  ED Discharge Orders        Ordered    metroNIDAZOLE (FLAGYL) 500 MG tablet  2 times daily     02/13/18 2215       Karrie MeresCouture, Ayiana Winslett S, PA-C 02/13/18 2220    Shaune PollackIsaacs, Cameron, MD 02/14/18 1104

## 2018-02-13 NOTE — Discharge Instructions (Addendum)
You were given an antibiotic today to treat you for suspected trichomonas since you had an exposure to this.  The antibiotic the you will need to take is called Flagyl.  You should not drink alcohol while you are taking this medication.  You should not have sexual contact until you have finished this medication.  You will need to follow-up with your primary care doctor in 1 week for reevaluation.  Return to the emergency department for any new or worsening symptoms.

## 2019-06-15 ENCOUNTER — Other Ambulatory Visit: Payer: Self-pay

## 2019-06-15 ENCOUNTER — Encounter (HOSPITAL_COMMUNITY): Payer: Self-pay | Admitting: *Deleted

## 2019-06-15 ENCOUNTER — Emergency Department (HOSPITAL_COMMUNITY)
Admission: EM | Admit: 2019-06-15 | Discharge: 2019-06-15 | Disposition: A | Discharge status: Home / Self Care | Payer: Self-pay | Attending: Emergency Medicine | Admitting: Emergency Medicine

## 2019-06-15 DIAGNOSIS — Z711 Person with feared health complaint in whom no diagnosis is made: Secondary | ICD-10-CM | POA: Insufficient documentation

## 2019-06-15 DIAGNOSIS — F17210 Nicotine dependence, cigarettes, uncomplicated: Secondary | ICD-10-CM | POA: Insufficient documentation

## 2019-06-15 DIAGNOSIS — R300 Dysuria: Secondary | ICD-10-CM | POA: Insufficient documentation

## 2019-06-15 DIAGNOSIS — N5089 Other specified disorders of the male genital organs: Secondary | ICD-10-CM | POA: Insufficient documentation

## 2019-06-15 DIAGNOSIS — Z7251 High risk heterosexual behavior: Secondary | ICD-10-CM | POA: Insufficient documentation

## 2019-06-15 LAB — URINALYSIS, ROUTINE W REFLEX MICROSCOPIC
Bacteria, UA: NONE SEEN
Bilirubin Urine: NEGATIVE
Glucose, UA: NEGATIVE mg/dL
Hgb urine dipstick: NEGATIVE
Ketones, ur: NEGATIVE mg/dL
Nitrite: NEGATIVE
Protein, ur: NEGATIVE mg/dL
Specific Gravity, Urine: 1.025 (ref 1.005–1.030)
WBC, UA: >50 WBC/hpf — ABNORMAL HIGH (ref 0–5)
pH: 6 (ref 5.0–8.0)

## 2019-06-15 MED ORDER — CEFTRIAXONE SODIUM 250 MG IJ SOLR
250.0000 mg | Freq: Once | INTRAMUSCULAR | Status: AC
  Administered 2019-06-15: 250 mg via INTRAMUSCULAR
  Filled 2019-06-15: qty 250

## 2019-06-15 MED ORDER — STERILE WATER FOR INJECTION IJ SOLN
INTRAMUSCULAR | Status: AC
  Administered 2019-06-15: 0.9 mL
  Filled 2019-06-15: qty 10

## 2019-06-15 MED ORDER — AZITHROMYCIN 250 MG PO TABS
1000.0000 mg | ORAL_TABLET | Freq: Once | ORAL | Status: AC
  Administered 2019-06-15: 22:00:00 1000 mg via ORAL
  Filled 2019-06-15: qty 4

## 2019-06-15 NOTE — Discharge Instructions (Signed)

## 2019-06-15 NOTE — ED Notes (Signed)
Discharge instructions discussed with pt. Pt. verbalized understanding. No questions at this time 

## 2019-06-15 NOTE — ED Triage Notes (Signed)
Pt having penile discharge white in color and penile swelling since having unprotected sex

## 2019-06-15 NOTE — ED Provider Notes (Signed)
[facility] EMERGENCY DEPARTMENT Provider Note   CSN: [id number] Arrival date & time: 06/15/19  2115    History   Chief Complaint Chief Complaint  Patient presents with  . Penile Discharge    HPI Julian Burton is a 28 y.o. male who presents for evaluation of penile discharge x3 days.  He states for the last 3 days, he has noticed some white/yellow discharge from his penis.  He states that he is also had some dysuria but has not noted any hematuria.  He states that yesterday, he felt like his right testicle was swollen but did not notice any overlying warmth or erythema.  He is no fevers or abdominal pain.  He is currently sexually active with multiple partners.  He did not use protection.  He does not engage in any anal intercourse.  He has a history of being treated for chlamydia about 2 years ago.     The history is provided by the patient.    Past Medical History:  Diagnosis Date  . Kidney stones     There are no active problems to display for this patient.   History reviewed. No pertinent surgical history.      Home Medications    Prior to Admission medications   Not on File    Family History No family history on file.  Social History Social History   Tobacco Use  . Smoking status: Current Every Day Smoker    Packs/day: 0.50    Types: Cigarettes  . Smokeless tobacco: Never Used  Substance Use Topics  . Alcohol use: Yes    Alcohol/week: 2.0 standard drinks    Types: 2 Shots of liquor per week    Comment: 2 shots every other day  . Drug use: No     Allergies   Patient has no known allergies.   Review of Systems Review of Systems  Constitutional: Negative for fever.  Genitourinary: Positive for discharge, dysuria and scrotal swelling. Negative for hematuria.  All other systems reviewed and are negative.    Physical Exam Updated Vital Signs BP 123/84   Pulse 81   Temp 98.3 F (36.8 C)   Resp 18   SpO2 100%    Physical Exam Vitals signs and nursing note reviewed. Exam conducted with a chaperone present.  Constitutional:      Appearance: He is well-developed.  HENT:     Head: Normocephalic and atraumatic.  Eyes:     General: No scleral icterus.       Right eye: No discharge.        Left eye: No discharge.     Conjunctiva/sclera: Conjunctivae normal.  Pulmonary:     Effort: Pulmonary effort is normal.  Abdominal:     Hernia: There is no hernia in the left inguinal area or right inguinal area.  Genitourinary:    Penis: Circumcised. Discharge present.      Scrotum/Testes:        Right: Tenderness or swelling not present.        Left: Tenderness or swelling not present.     Comments: The exam was performed with a chaperone present. Normal male genitalia. No evidence of rash, ulcers or lesions.  White discharge noted at the urethra.  No tenderness palpation of the bilateral testicles.  They appear symmetric in appearance without any overlying warmth, erythema, edema.  No blood clot deformity.  No hernia bilaterally. Skin:    General: Skin is warm and dry.  Neurological:  Mental Status: He is alert.  Psychiatric:        Speech: Speech normal.        Behavior: Behavior normal.      ED Treatments / Results  Labs (all labs ordered are listed, but only abnormal results are displayed) Labs Reviewed  URINALYSIS, ROUTINE W REFLEX MICROSCOPIC - Abnormal; Notable for the following components:      Result Value   APPearance HAZY (*)    Leukocytes,Ua LARGE (*)    WBC, UA >50 (*)    All other components within normal limits  GC/CHLAMYDIA PROBE AMP ([place]) NOT AT [facility]    EKG None  Radiology No results found.  Procedures Procedures (including critical care time)  Medications Ordered in ED Medications  cefTRIAXone (ROCEPHIN) injection 250 mg (250 mg Intramuscular Given 06/15/19 2209)  azithromycin (ZITHROMAX) tablet 1,000 mg (1,000 mg Oral Given 06/15/19 2208)  sterile water  (preservative free) injection (0.9 mLs  Given 06/15/19 2209)     Initial Impression / Assessment and Plan / ED Course  I have reviewed the triage vital signs and the nursing notes.  Pertinent labs & imaging results that were available during my care of the patient were reviewed by me and considered in my medical decision making (see chart for details).        28 y.o. M who presents for evaluation of penile discharge. Patient is afebrile, non-toxic appearing, sitting comfortably on examination table. Vital signs reviewed and stable. GU exam shows white discharge noted from penis.  I did feel like he had some swelling of the right testicle but on palpation, he had no tenderness and I did not appreciate any swelling, warmth, erythema, edema.  Exam not concerning for epididymitis, testicular torsion.  STD cultures obtained and pending. Discussed with patient treatment options including treatment today or waiting until cultures returned for treatment.  Patient wishes to have treatment today. Patient with no known drug allergies.  Discussed importance of using protection when sexually active. Pt understands that they have GC/Chlamydia cultures pending and that they will need to inform all sexual partners if results return positive. Patient has been treated prophylactically. Patient had ample opportunity for questions and discussion. All patient's questions were answered with full understanding. Strict return precautions discussed. Patient expresses understanding and agreement to plan.   Portions of this note were generated with Scientist, clinical (histocompatibility and immunogenetics)Dragon dictation software. Dictation errors may occur despite best attempts at proofreading.    Final Clinical Impressions(s) / ED Diagnoses   Final diagnoses:  Concern about STD in male without diagnosis    ED Discharge Orders    None       Rosana HoesLayden, Lindsey A, PA-C 06/15/19 2241    Maia PlanLong, Joshua G, MD 06/16/19 [phone]

## 2019-06-17 LAB — GC/CHLAMYDIA PROBE AMP (~~LOC~~) NOT AT ARMC
Chlamydia: POSITIVE — AB
Neisseria Gonorrhea: POSITIVE — AB

## 2019-09-28 ENCOUNTER — Emergency Department (HOSPITAL_COMMUNITY): Payer: No Typology Code available for payment source

## 2019-09-28 ENCOUNTER — Emergency Department (HOSPITAL_COMMUNITY)
Admission: EM | Admit: 2019-09-28 | Discharge: 2019-09-28 | Disposition: A | Discharge status: Home / Self Care | Payer: No Typology Code available for payment source | Attending: Emergency Medicine | Admitting: Emergency Medicine

## 2019-09-28 DIAGNOSIS — M25511 Pain in right shoulder: Secondary | ICD-10-CM | POA: Insufficient documentation

## 2019-09-28 DIAGNOSIS — M545 Low back pain: Secondary | ICD-10-CM | POA: Diagnosis not present

## 2019-09-28 DIAGNOSIS — M546 Pain in thoracic spine: Secondary | ICD-10-CM | POA: Insufficient documentation

## 2019-09-28 DIAGNOSIS — M25512 Pain in left shoulder: Secondary | ICD-10-CM

## 2019-09-28 MED ORDER — METHOCARBAMOL 500 MG PO TABS: 500.0000 mg | ORAL_TABLET | Freq: Two times a day (BID) | ORAL | 0 refills | Status: DC

## 2019-09-28 MED ORDER — MELOXICAM 7.5 MG PO TABS: 7.5000 mg | ORAL_TABLET | Freq: Every day | ORAL | 0 refills | Status: DC

## 2019-09-28 MED ORDER — HYDROCODONE-ACETAMINOPHEN 5-325 MG PO TABS
1.0000 | ORAL_TABLET | Freq: Once | ORAL | Status: AC
  Administered 2019-09-28: 18:00:00 1 via ORAL
  Filled 2019-09-28: qty 1

## 2019-09-28 NOTE — ED Notes (Signed)
No answer for triage.

## 2019-09-28 NOTE — ED Notes (Signed)
Patient transported to X-ray 

## 2019-09-28 NOTE — ED Notes (Signed)
Patient verbalizes understanding of discharge instructions. Opportunity for questioning and answers were provided. Armband removed by staff, pt discharged from ED.  

## 2019-09-28 NOTE — Discharge Instructions (Addendum)
I am sending you home with meloxicam and robaxin. Take as prescribed and do not take other OTC with this medication. Robaxin is a muscle relaxer which can cause drowsy, so do not drive or operate machinery while on the medication. I have given you the number of an orthopedic doctor. If you shoulder pain does not improve within the next week, call to schedule an appointment. Follow-up with your PCP if your pain continues. Return to the ER for new or worsening symptoms.

## 2019-09-28 NOTE — ED Notes (Signed)
Called pt X3 for triage no answer

## 2019-09-28 NOTE — ED Notes (Signed)
Contacted CT to make sure the scans have crossed over and will be read soon. CT is following up.
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
## 2019-09-28 NOTE — ED Notes (Signed)
Called [facility] to inquire about reading. The lumbar and thoracic scan were read at 1938. There is no acute fracture. PA Lanna Poche also spoke to [facility] to obtain results verbally since the report cannot be faxed from home.

## 2019-09-28 NOTE — ED Notes (Signed)
Pt requesting permission to go out to smoke a cigarette, pt oriented to wait for the RN or provider to talk to him on his room.

## 2019-09-28 NOTE — ED Notes (Signed)
Patient transported to CT 
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
## 2021-06-22 ENCOUNTER — Encounter (HOSPITAL_COMMUNITY): Payer: Self-pay | Admitting: *Deleted

## 2021-06-22 ENCOUNTER — Emergency Department (HOSPITAL_COMMUNITY): Payer: Self-pay

## 2021-06-22 ENCOUNTER — Other Ambulatory Visit: Payer: Self-pay

## 2021-06-22 ENCOUNTER — Emergency Department (HOSPITAL_COMMUNITY)
Admission: EM | Admit: 2021-06-22 | Discharge: 2021-06-22 | Disposition: A | Discharge status: Home / Self Care | Payer: Self-pay | Attending: Emergency Medicine | Admitting: Emergency Medicine

## 2021-06-22 DIAGNOSIS — U071 COVID-19: Secondary | ICD-10-CM

## 2021-06-22 DIAGNOSIS — R0602 Shortness of breath: Secondary | ICD-10-CM

## 2021-06-22 DIAGNOSIS — F17210 Nicotine dependence, cigarettes, uncomplicated: Secondary | ICD-10-CM | POA: Insufficient documentation

## 2021-06-22 LAB — RESP PANEL BY RT-PCR (FLU A&B, COVID) ARPGX2
Influenza A by PCR: NEGATIVE
Influenza B by PCR: NEGATIVE
SARS Coronavirus 2 by RT PCR: POSITIVE — AB

## 2021-06-22 MED ORDER — BENZONATATE 100 MG PO CAPS: 100.0000 mg | ORAL_CAPSULE | Freq: Three times a day (TID) | ORAL | 0 refills | Status: AC

## 2021-06-22 MED ORDER — ACETAMINOPHEN 500 MG PO TABS: 1000.0000 mg | ORAL_TABLET | Freq: Four times a day (QID) | ORAL | 0 refills | Status: AC | PRN

## 2021-06-22 MED ORDER — ACETAMINOPHEN 500 MG PO TABS
1000.0000 mg | ORAL_TABLET | Freq: Once | ORAL | Status: AC
  Administered 2021-06-22: 1000 mg via ORAL
  Filled 2021-06-22: qty 2

## 2021-06-22 NOTE — Discharge Instructions (Addendum)
You test positive for COVID-19 infection.  Please follow instruction below. Recommendations for at home COVID-19 symptoms management:  Please continue isolation at home. Call [phone] to see whether you might be eligible for therapeutic antibody infusions (leave your name and they will call you back).  If have acute worsening of symptoms please go to ER/urgent care for further evaluation. Check pulse oximetry and if below 90-92% please go to ER. The following supplements MAY help:  Vitamin C 500mg  twice a day and Quercetin 250-500 mg twice a day Vitamin D3 2000 - 4000 u/day B Complex vitamins Zinc 75-100 mg/day Melatonin 6-10 mg at night (the optimal dose is unknown) Aspirin 81mg /day (if no history of bleeding issues)

## 2021-06-22 NOTE — ED Triage Notes (Signed)
The pt has a cold  fatigue headache diffixulty walking sleeping more since yesterday

## 2021-06-22 NOTE — ED Provider Notes (Signed)
[facility] EMERGENCY DEPARTMENT Provider Note   CSN: [id number] Arrival date & time: 06/22/21  1619     History Chief Complaint  Patient presents with   Fatigue    Julian Burton is a 30 y.o. male.  The history is provided by the patient. No language interpreter was used.   Generally healthy 30 year old male who presents with COVID symptoms.  Patient reports since yesterday he developed fever, chills, headache, generalized fatigue, body aches, and overall not feeling well.  He does not complain of any significant shortness of breath, loss of taste or smell.  He does endorse some mild loose stools.  He has been fully vaccinated for COVID-19.  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  He did take Tylenol at home with minimal improvement.  Past Medical History:  Diagnosis Date   Kidney stones     There are no problems to display for this patient.   History reviewed. No pertinent surgical history.     No family history on file.  Social History   Tobacco Use   Smoking status: Every Day    Packs/day: 0.50    Types: Cigarettes   Smokeless tobacco: Never  Vaping Use   Vaping Use: Never used  Substance Use Topics   Alcohol use: Yes    Alcohol/week: 2.0 standard drinks    Types: 2 Shots of liquor per week    Comment: 2 shots every other day   Drug use: No    Home Medications Prior to Admission medications   Medication Sig Start Date End Date Taking? Authorizing Provider  meloxicam (MOBIC) 7.5 MG tablet Take 1 tablet (7.5 mg total) by mouth daily. 09/28/19   Mannie Stabile, PA-C  methocarbamol (ROBAXIN) 500 MG tablet Take 1 tablet (500 mg total) by mouth 2 (two) times daily. 09/28/19   Mannie Stabile, PA-C    Allergies    Patient has no known allergies.  Review of Systems   Review of Systems  All other systems reviewed and are negative.  Physical Exam Updated  Vital Signs BP 126/78   Pulse 88   Temp (!) 100.9 F (38.3 C)   Resp 20   Ht 5\' 10"  (1.778 m)   Wt 68 kg   SpO2 99%   BMI 21.51 kg/m   Physical Exam Vitals and nursing note reviewed.  Constitutional:      General: He is not in acute distress.    Appearance: He is well-developed.  HENT:     Head: Atraumatic.     Nose: Nose normal.     Mouth/Throat:     Mouth: Mucous membranes are moist.  Eyes:     Conjunctiva/sclera: Conjunctivae normal.  Cardiovascular:     Rate and Rhythm: Normal rate.     Pulses: Normal pulses.     Heart sounds: Normal heart sounds.  Pulmonary:     Effort: Pulmonary effort is normal.     Breath sounds: Normal breath sounds. No wheezing, rhonchi or rales.  Abdominal:     Palpations: Abdomen is soft.  Musculoskeletal:     Cervical back: Neck supple.  Skin:    Findings: No rash.  Neurological:     Mental Status: He is alert and oriented to person, place, and time.    ED Results / Procedures / Treatments   Labs (all labs ordered are listed, but only abnormal results are displayed) Labs  Reviewed  RESP PANEL BY RT-PCR (FLU A&B, COVID) ARPGX2 - Abnormal; Notable for the following components:      Result Value   SARS Coronavirus 2 by RT PCR POSITIVE (*)    All other components within normal limits    EKG None  Radiology DG Chest Port 1 View  Result Date: 06/22/2021 CLINICAL DATA:  Shortness of breath. Fatigue. Covid-19 viral infection. EXAM: PORTABLE CHEST 1 VIEW COMPARISON:  None. FINDINGS: The heart size and mediastinal contours are within normal limits. Both lungs are clear. The visualized skeletal structures are unremarkable. IMPRESSION: Normal exam. Electronically Signed   By: Danae Orleans M.D.   On: 06/22/2021 20:31    Procedures Procedures   Medications Ordered in ED Medications  acetaminophen (TYLENOL) tablet 1,000 mg (has no administration in time range)    ED Course  I have reviewed the triage vital signs and the nursing  notes.  Pertinent labs & imaging results that were available during my care of the patient were reviewed by me and considered in my medical decision making (see chart for details).    MDM Rules/Calculators/A&P                           BP 126/78   Pulse 88   Temp (!) 100.9 F (38.3 C)   Resp 20   Ht 5\' 10"  (1.778 m)   Wt 68 kg   SpO2 99%   BMI 21.51 kg/m   Final Clinical Impression(s) / ED Diagnoses Final diagnoses:  COVID-19 virus infection    Rx / DC Orders ED Discharge Orders     None      9:21 PM Patient here with COVID symptoms.  He did test positive for COVID infection.  Chest x-ray unremarkable.  He is overall well-appearing, no hypoxia.  He does have an elevated temperature of 100.9, Tylenol given.  Labs and chest x-ray was independently reviewed and interpreted by me.  Patient does not qualify for oral antiviral medication as he does not have any significant medical risk factors.  Work note provided, return precaution given.  Julian Burton was evaluated in Emergency Department on 06/22/2021 for the symptoms described in the history of present illness. He was evaluated in the context of the global COVID-19 pandemic, which necessitated consideration that the patient might be at risk for infection with the SARS-CoV-2 virus that causes COVID-19. Institutional protocols and algorithms that pertain to the evaluation of patients at risk for COVID-19 are in a state of rapid change based on information released by regulatory bodies including the CDC and federal and state organizations. These policies and algorithms were followed during the patient's care in the ED.    06/24/2021, PA-C 06/22/21 2234    2235, DO 06/23/21 [phone]

## 2021-06-22 NOTE — ED Provider Notes (Signed)
Emergency Medicine Provider Triage Evaluation Note  Julian Burton , a 30 y.o. male  was evaluated in triage.  Pt complains of cough, congestion, fatigue, circumferential headache, feels tired states that he came to the ER because of how tired he feels.  Denies any chest pain shortness of breath abdominal pain states he took a dose of Tylenol 4 hours ago.   Review of Systems  Positive: Cough, congestion Negative: Chest pain  Physical Exam  Ht 5\' 10"  (1.778 m)   Wt 68 kg   BMI 21.51 kg/m  Gen:   Awake, no distress  Resp:  Normal effort  MSK:   Moves extremities without difficulty  Other:  Coarse lung sounds in the right lungs.  Medical Decision Making  Medically screening exam initiated at 5:34 PM.  Appropriate orders placed.  DOMINGOS RIGGI was informed that the remainder of the evaluation will be completed by another provider, this initial triage assessment does not replace that evaluation, and the importance of remaining in the ED until their evaluation is complete.  Patient is overall not ill-appearing 30 year old male speaking in full sentences.  Concerned about fatigue.  Vaccinated x2 for COVID.  Works in [employer] is around plenty of people on a daily basis.   [employer], Gailen Shelter 06/22/21 1737    06/24/21, MD 06/22/21 [phone]

## 2021-06-22 NOTE — ED Notes (Signed)
Pt had tylenol 4 hours ago

## 2021-09-25 ENCOUNTER — Emergency Department (HOSPITAL_COMMUNITY)
Admission: EM | Admit: 2021-09-25 | Discharge: 2021-09-25 | Disposition: A | Discharge status: Home / Self Care | Payer: Self-pay | Attending: Emergency Medicine | Admitting: Emergency Medicine

## 2021-09-25 ENCOUNTER — Encounter (HOSPITAL_COMMUNITY): Payer: Self-pay

## 2021-09-25 ENCOUNTER — Other Ambulatory Visit: Payer: Self-pay

## 2021-09-25 DIAGNOSIS — R300 Dysuria: Secondary | ICD-10-CM | POA: Insufficient documentation

## 2021-09-25 DIAGNOSIS — F17210 Nicotine dependence, cigarettes, uncomplicated: Secondary | ICD-10-CM | POA: Insufficient documentation

## 2021-09-25 DIAGNOSIS — K5900 Constipation, unspecified: Secondary | ICD-10-CM | POA: Insufficient documentation

## 2021-09-25 DIAGNOSIS — R1030 Lower abdominal pain, unspecified: Secondary | ICD-10-CM | POA: Insufficient documentation

## 2021-09-25 LAB — URINALYSIS, ROUTINE W REFLEX MICROSCOPIC
Bilirubin Urine: NEGATIVE
Glucose, UA: NEGATIVE mg/dL
Hgb urine dipstick: NEGATIVE
Ketones, ur: NEGATIVE mg/dL
Leukocytes,Ua: NEGATIVE
Nitrite: NEGATIVE
Protein, ur: NEGATIVE mg/dL
Specific Gravity, Urine: 1.018 (ref 1.005–1.030)
pH: 5 (ref 5.0–8.0)

## 2021-09-25 MED ORDER — AZITHROMYCIN 250 MG PO TABS
1000.0000 mg | ORAL_TABLET | Freq: Once | ORAL | Status: AC
  Administered 2021-09-25: 1000 mg via ORAL
  Filled 2021-09-25: qty 4

## 2021-09-25 MED ORDER — KETOROLAC TROMETHAMINE 15 MG/ML IJ SOLN
15.0000 mg | Freq: Once | INTRAMUSCULAR | Status: AC
  Administered 2021-09-25: 15 mg via INTRAMUSCULAR
  Filled 2021-09-25: qty 1

## 2021-09-25 MED ORDER — CEFTRIAXONE SODIUM 500 MG IJ SOLR
500.0000 mg | Freq: Once | INTRAMUSCULAR | Status: DC
  Filled 2021-09-25: qty 500

## 2021-09-25 MED ORDER — LIDOCAINE HCL (PF) 1 % IJ SOLN
1.0000 mL | Freq: Once | INTRAMUSCULAR | Status: DC
  Filled 2021-09-25: qty 5

## 2021-09-25 MED ORDER — OXYCODONE-ACETAMINOPHEN 5-325 MG PO TABS
2.0000 | ORAL_TABLET | Freq: Once | ORAL | Status: AC
Start: 2021-09-25 — End: 2021-09-25
  Administered 2021-09-25: 2 via ORAL
  Filled 2021-09-25: qty 2

## 2021-09-25 NOTE — Discharge Instructions (Addendum)
Please use Tylenol or ibuprofen for pain.  You may use 600 mg ibuprofen every 6 hours or 1000 mg of Tylenol every 6 hours.  You may choose to alternate between the 2.  This would be most effective.  Not to exceed 4 g of Tylenol within 24 hours.  Not to exceed 3200 mg ibuprofen 24 hours.  Please follow your GC/Chlamydia results online to see the results. Please follow up with urology if you continue to have pain.

## 2021-09-25 NOTE — ED Provider Notes (Signed)
[facility] Provider Note   CSN: [id number] Arrival date & time: 09/25/21  1115     History Chief Complaint  Patient presents with   Groin Pain    [facility] is a 30 y.o. male with a past medical history significant for recurrent visits for gonorrhea, chlamydia, as well as history of kidney stones who presents with 2 days of pain with urination.  Patient reports significant pain with urination without penile discharge, odor, recent fever.  Patient denies any traumatic instrumentation of the urethra.  Patient denies any traumatic bending, or even sexual activity for the last 3 months.  Patient denies flank pain, abdominal pain, nausea, vomiting.  Patient denies hematuria, change in color of urination.  Patient does report some increased constipation recently.  Patient with no difficulty maintaining erection, or evacuating bladder, other than significant pain.   Groin Pain      Past Medical History:  Diagnosis Date   Kidney stones     There are no problems to display for this patient.   History reviewed. No pertinent surgical history.     No family history on file.  Social History   Tobacco Use   Smoking status: Every Day    Packs/day: 0.50    Types: Cigarettes   Smokeless tobacco: Never  Vaping Use   Vaping Use: Never used  Substance Use Topics   Alcohol use: Yes    Alcohol/week: 2.0 standard drinks    Types: 2 Shots of liquor per week    Comment: 2 shots every other day   Drug use: No    Home Medications Prior to Admission medications   Medication Sig Start Date End Date Taking? Authorizing Provider  acetaminophen (TYLENOL) 500 MG tablet Take 2 tablets (1,000 mg total) by mouth every 6 (six) hours as needed for fever or headache. 06/22/21   Domenic Moras, PA-C  benzonatate (TESSALON) 100 MG capsule Take 1 capsule (100 mg total) by mouth every 8 (eight) hours. 06/22/21   Domenic Moras, PA-C    Allergies    Patient has  no known allergies.  Review of Systems   Review of Systems  Genitourinary:  Positive for dysuria and penile pain.  All other systems reviewed and are negative.  Physical Exam Updated Vital Signs BP 115/69 (BP Location: Right Arm)   Pulse 76   Temp 99 F (37.2 C) (Oral)   Resp 16   Ht 5\' 10"  (1.778 m)   Wt 63.5 kg   SpO2 99%   BMI 20.09 kg/m   Physical Exam Vitals and nursing note reviewed.  Constitutional:      General: He is not in acute distress.    Appearance: Normal appearance.  HENT:     Head: Normocephalic and atraumatic.  Eyes:     General:        Right eye: No discharge.        Left eye: No discharge.  Cardiovascular:     Rate and Rhythm: Normal rate and regular rhythm.  Pulmonary:     Effort: Pulmonary effort is normal. No respiratory distress.  Genitourinary:    Penis: Normal.      Comments: Overall normal-appearing penis with no tenderness to palpation.  There are some lesions on the underside of the head of the penis that are somewhat suspicious for HPV, however patient reports these been present for a long time.  There is no evidence of penile discharge.  There is no tenderness of bilateral  testicles.  Testicles are normal size, normal cremasteric reflex. Musculoskeletal:        General: No deformity.  Skin:    General: Skin is warm and dry.  Neurological:     Mental Status: He is alert and oriented to person, place, and time.  Psychiatric:        Mood and Affect: Mood normal.        Behavior: Behavior normal.    ED Results / Procedures / Treatments   Labs (all labs ordered are listed, but only abnormal results are displayed) Labs Reviewed  URINALYSIS, ROUTINE W REFLEX MICROSCOPIC  GC/CHLAMYDIA PROBE AMP ([place]) NOT AT [facility]    EKG None  Radiology No results found.  Procedures Procedures   Medications Ordered in ED Medications  cefTRIAXone (ROCEPHIN) injection 500 mg (500 mg Intramuscular Patient Refused/Not Given 09/25/21 1504)   lidocaine (PF) (XYLOCAINE) 1 % injection 1 mL (1 mL Other Patient Refused/Not Given 09/25/21 1504)  ketorolac (TORADOL) 15 MG/ML injection 15 mg (15 mg Intramuscular Given 09/25/21 1501)  oxyCODONE-acetaminophen (PERCOCET/ROXICET) 5-325 MG per tablet 2 tablet (2 tablets Oral Given 09/25/21 1458)  azithromycin (ZITHROMAX) tablet 1,000 mg (1,000 mg Oral Given 09/25/21 1458)    ED Course  I have reviewed the triage vital signs and the nursing notes.  Pertinent labs & imaging results that were available during my care of the patient were reviewed by me and considered in my medical decision making (see chart for details).    MDM Rules/Calculators/A&P                         I discussed this case with my attending physician who cosigned this note including patient's presenting symptoms, physical exam, and planned diagnostics and interventions. Attending physician stated agreement with plan or made changes to plan which were implemented.   Overall well-appearing male with no signs and symptoms of STI on exam today.  Urinalysis does not show any evidence of UTI.  Patient physical exam does not suggest kidney stone, or pyelonephritis.  Discussed differential for urethritis including possible chemical urethritis, reactivation of inadequately treated STI, retrograde ejaculation, versus other.  Employed shared decision making and discussion to treat empirically with antibiotics given history of sexually transmitted infection.   Patient does wish to treat with antibiotics at this time.  We will administer azithromycin, and Rocephin one-time doses.  Concern for patient adequate follow-up, and medical adherence is reason for not administering first-line doxycycline instead of azithromycin at this time.  We will control patient's pain prior to discharge.  Encouraged follow-up with PCP, or urology for further investigation, possible cystoscopy if patient symptoms continue without improvement.  No evidence for  emergent general conditions including testicular torsion, or epididymitis. Patient discharged in stable condition at this time. Return precautions given. Final Clinical Impression(s) / ED Diagnoses Final diagnoses:  Dysuria    Rx / DC Orders ED Discharge Orders     None        [place] 09/25/21 1513    Cathren Laine, MD 09/26/21 1525

## 2021-09-25 NOTE — ED Triage Notes (Signed)
Pt reports pain when urinating. Denies penile discharge, odor, fevers.

## 2022-10-24 ENCOUNTER — Ambulatory Visit (HOSPITAL_COMMUNITY)
Admission: EM | Admit: 2022-10-24 | Discharge: 2022-10-24 | Disposition: A | Discharge status: Home / Self Care | Payer: No Payment, Other | Attending: Behavioral Health | Admitting: Behavioral Health

## 2022-10-24 DIAGNOSIS — F22 Delusional disorders: Secondary | ICD-10-CM

## 2022-10-24 DIAGNOSIS — F1910 Other psychoactive substance abuse, uncomplicated: Secondary | ICD-10-CM | POA: Insufficient documentation
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
## 2022-10-24 NOTE — Discharge Instructions (Addendum)
Discharge recommendations:   Outpatient Follow up: Please review list of outpatient resources for psychiatry and counseling. Please follow up with your primary care provider for all medical related needs.   Therapy: We recommend that patient participate in individual therapy to address mental health concerns.  Safety:   The following safety precautions should be taken:   No sharp objects. This includes scissors, razors, scrapers, and putty knives.   Chemicals should be removed and locked up.   Medications should be removed and locked up.   Weapons should be removed and locked up. This includes firearms, knives and instruments that can be used to cause injury.   The patient should abstain from use of illicit substances/drugs and abuse of any medications.  If symptoms worsen or do not continue to improve or if the patient becomes actively suicidal or homicidal then it is recommended that the patient return to the closest hospital emergency department, the [facility], or call 911 for further evaluation and treatment. National Suicide Prevention Lifeline 1-800-SUICIDE or 1-800-273-8255.  About 988 988 offers 24/7 access to trained crisis counselors who can help people experiencing mental health-related distress. People can call or text 988 or chat 988lifeline.org for themselves or if they are worried about a loved one who may need crisis support.     
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
